# Patient Record
Sex: Male | Born: 1970 | Race: White | Hispanic: No | Smoking: Current every day smoker
Health system: Southern US, Community
[De-identification: ages and names within clinical notes are randomized; demographics above are authoritative.]

---

## 2001-07-15 ENCOUNTER — Emergency Department (HOSPITAL_COMMUNITY): Admission: EM | Admit: 2001-07-15 | Discharge: 2001-07-15 | Payer: Self-pay | Admitting: Emergency Medicine

## 2001-07-15 ENCOUNTER — Encounter: Payer: Self-pay | Admitting: Emergency Medicine

## 2012-04-25 ENCOUNTER — Ambulatory Visit: Payer: PRIVATE HEALTH INSURANCE

## 2012-04-25 ENCOUNTER — Ambulatory Visit (INDEPENDENT_AMBULATORY_CARE_PROVIDER_SITE_OTHER): Payer: PRIVATE HEALTH INSURANCE | Admitting: Internal Medicine

## 2012-04-25 VITALS — BP 136/77 | HR 56 | Temp 98.0°F | Resp 16 | Ht 68.0 in | Wt 172.8 lb

## 2012-04-25 DIAGNOSIS — S39012A Strain of muscle, fascia and tendon of lower back, initial encounter: Secondary | ICD-10-CM

## 2012-04-25 DIAGNOSIS — M545 Low back pain, unspecified: Secondary | ICD-10-CM

## 2012-04-25 DIAGNOSIS — IMO0002 Reserved for concepts with insufficient information to code with codable children: Secondary | ICD-10-CM

## 2012-04-25 LAB — POCT URINALYSIS DIPSTICK
Bilirubin, UA: NEGATIVE
Blood, UA: NEGATIVE
Glucose, UA: NEGATIVE
Ketones, UA: NEGATIVE
Leukocytes, UA: NEGATIVE
Nitrite, UA: NEGATIVE
Protein, UA: NEGATIVE
Spec Grav, UA: 1.02
Urobilinogen, UA: 0.2
pH, UA: 7

## 2012-04-25 LAB — POCT UA - MICROSCOPIC ONLY
Bacteria, U Microscopic: NEGATIVE
Casts, Ur, LPF, POC: NEGATIVE
Crystals, Ur, HPF, POC: NEGATIVE
Epithelial cells, urine per micros: NEGATIVE
Mucus, UA: NEGATIVE
RBC, urine, microscopic: NEGATIVE
Yeast, UA: NEGATIVE

## 2012-04-25 MED ORDER — METHOCARBAMOL 750 MG PO TABS: 750.0000 mg | ORAL_TABLET | Freq: Four times a day (QID) | ORAL | Status: DC

## 2012-04-25 MED ORDER — HYDROCODONE-ACETAMINOPHEN 5-500 MG PO TABS: 1.0000 | ORAL_TABLET | Freq: Three times a day (TID) | ORAL | Status: DC | PRN

## 2012-04-25 NOTE — Progress Notes (Signed)
  Subjective:    Patient ID: Jerry Hayes, male    DOB: 09-06-1970, 41 y.o.   MRN: [id number]  HPI Has 2-3d of right LBP, radiates to right upper leg with some pain in testicle. No blood seen in urine. No hx for injury, does difficult physical work and lifts and twists a lot. No weakness, numbness, or incontinence.   Review of Systems smoker    Objective:   Physical Exam  Constitutional: He is oriented to person, place, and time. He appears well-developed and well-nourished. He appears distressed.  Neck: Normal range of motion.  Cardiovascular: Normal rate.   Pulmonary/Chest: Effort normal and breath sounds normal.  Abdominal: Soft. Bowel sounds are normal. There is no tenderness.  Musculoskeletal: He exhibits tenderness.       Lumbar back: He exhibits decreased range of motion, tenderness, pain and spasm. He exhibits no bony tenderness and no deformity.       Back:       Limping St leg painful in back both legs  Neurological: He is alert and oriented to person, place, and time. He has normal reflexes. He exhibits normal muscle tone. Coordination abnormal.  Skin: Skin is warm and dry.  Psychiatric: He has a normal mood and affect.     [facility] reading (PRIMARY) by  Dr.Kianna Billet djd at L5-S1 ccua Results for orders placed in visit on 04/25/12  POCT UA - MICROSCOPIC ONLY      Component Value Range   WBC, Ur, HPF, POC 0-1     RBC, urine, microscopic neg     Bacteria, U Microscopic neg     Mucus, UA neg     Epithelial cells, urine per micros neg     Crystals, Ur, HPF, POC neg     Casts, Ur, LPF, POC neg     Yeast, UA neg    POCT URINALYSIS DIPSTICK      Component Value Range   Color, UA yellow     Clarity, UA clear     Glucose, UA neg     Bilirubin, UA neg     Ketones, UA neg     Spec Grav, UA 1.020     Blood, UA neg     pH, UA 7.0     Protein, UA neg     Urobilinogen, UA 0.2     Nitrite, UA neg     Leukocytes, UA Negative          Assessment & Plan:  Back  manual/Rest/meds

## 2012-04-28 ENCOUNTER — Ambulatory Visit (INDEPENDENT_AMBULATORY_CARE_PROVIDER_SITE_OTHER): Payer: PRIVATE HEALTH INSURANCE | Admitting: Emergency Medicine

## 2012-04-28 VITALS — BP 120/72 | HR 70 | Temp 98.1°F | Resp 16 | Ht 67.75 in | Wt 179.2 lb

## 2012-04-28 DIAGNOSIS — M549 Dorsalgia, unspecified: Secondary | ICD-10-CM

## 2012-04-28 DIAGNOSIS — IMO0002 Reserved for concepts with insufficient information to code with codable children: Secondary | ICD-10-CM

## 2012-04-28 MED ORDER — PREDNISONE 20 MG PO TABS: ORAL_TABLET | ORAL | Status: DC

## 2012-04-28 MED ORDER — OXYCODONE-ACETAMINOPHEN 5-325 MG PO TABS: 1.0000 | ORAL_TABLET | Freq: Three times a day (TID) | ORAL | Status: DC | PRN

## 2012-04-28 NOTE — Patient Instructions (Addendum)
Do not take any more ibuprofen. Take her prednisone as instructed to take her pain medications as needed proceed with your MRI so that we can evaluate your back disease.

## 2012-04-28 NOTE — Progress Notes (Signed)
  Subjective:    Patient ID: Jerry Hayes, male    DOB: Jan 31, 1971, 41 y.o.   MRN: [id number]  HPI  Was seen Sunday for back pain.  Pain radiates from lower back down right leg and back into groin.  Has had some strains and pulls but never anything like this.  No loss of bowel or bladder.  Stool was a little harder but nothing major.  Pain is not getting any better.  Sore in the middle of back.  Sitting hurts worse than laying down.  Standing feels better than anything.      Review of Systems     Objective:   Physical Exam        Assessment & Plan:

## 2012-05-11 ENCOUNTER — Ambulatory Visit (INDEPENDENT_AMBULATORY_CARE_PROVIDER_SITE_OTHER): Payer: PRIVATE HEALTH INSURANCE | Admitting: Emergency Medicine

## 2012-05-11 VITALS — BP 126/71 | HR 76 | Temp 98.7°F | Resp 16 | Ht 67.0 in | Wt 179.0 lb

## 2012-05-11 DIAGNOSIS — M5430 Sciatica, unspecified side: Secondary | ICD-10-CM

## 2012-05-11 MED ORDER — HYDROCODONE-ACETAMINOPHEN 5-325 MG PO TABS: 1.0000 | ORAL_TABLET | ORAL | Status: AC | PRN

## 2012-05-11 NOTE — Progress Notes (Signed)
[facility] [address], [place] [address] [phone]  Date:  05/11/2012   Name:  Jerry Hayes   DOB:  1970/09/17   MRN:  [id number]  PCP:  No primary provider on file.    Chief Complaint: Follow-up   History of Present Illness:  Jerry Hayes is a 41 y.o. very pleasant male patient who presents with the following:  Seen twice with back pain into leg.  First visit just had pain.  Second visit had numbness in leg.  Put on prednisone and percocet on 10/9 and the numbness in right leg has improved but is still present.  In the anterior thigh.  Has returned to work.  Pain markedly worse with sitting. Told that insurance will not pay for MRI for 6 weeks.  There is no problem list on file for this patient.   No past medical history on file.  No past surgical history on file.  History  Substance Use Topics  . Smoking status: Current Every Day Smoker  . Smokeless tobacco: Not on file  . Alcohol Use: Not on file    No family history on file.  No Known Allergies  Medication list has been reviewed and updated.  Current Outpatient Prescriptions on File Prior to Visit  Medication Sig Dispense Refill  . methocarbamol (ROBAXIN-750) 750 MG tablet Take 1 tablet (750 mg total) by mouth 4 (four) times daily.  40 tablet  1  . HYDROcodone-acetaminophen (VICODIN) 5-500 MG per tablet Take 1 tablet by mouth every 8 (eight) hours as needed for pain.  40 tablet  0  . ibuprofen (ADVIL,MOTRIN) 400 MG tablet Take 400 mg by mouth every 6 (six) hours as needed.      Marland Kitchen oxyCODONE-acetaminophen (ROXICET) 5-325 MG per tablet Take 1 tablet by mouth every 8 (eight) hours as needed for pain.  20 tablet  0    Review of Systems:  As per HPI, otherwise negative.    Physical Examination: Filed Vitals:   05/11/12 1501  BP: 126/71  Pulse: 76  Temp: 98.7 F (37.1 C)  Resp: 16   Filed Vitals:   05/11/12 1501  Height: 5\' 7"  (1.702 m)  Weight: 179 lb (81.194 kg)   Body  mass index is 28.04 kg/(m^2). Ideal Body Weight: Weight in (lb) to have BMI = 25: 159.3   GEN: WDWN, NAD, Non-toxic, A & O x 3 HEENT: Atraumatic, Normocephalic. Neck supple. No masses, No LAD. Ears and Nose: No external deformity. CV: RRR, No M/G/R. No JVD. No thrill. No extra heart sounds. PULM: CTA B, no wheezes, crackles, rhonchi. No retractions. No resp. distress. No accessory muscle use. ABD: S, NT, ND, +BS. No rebound. No HSM. EXTR: No c/c/e NEURO antalgic gait.   Decreased reflex knee and ankle on right.  Motor intact PSYCH: Normally interactive. Conversant. Not depressed or anxious appearing.  Calm demeanor.  BACK:  Tender lumbar paraspinous on right and in sciatic notch.    Assessment and Plan: Sciatic neuritis Pursue MRI   Carmelina Dane, MD  I have reviewed and agree with documentation. Robert P. Merla Riches, M.D.

## 2013-01-18 ENCOUNTER — Ambulatory Visit (INDEPENDENT_AMBULATORY_CARE_PROVIDER_SITE_OTHER): Payer: PRIVATE HEALTH INSURANCE | Admitting: Physician Assistant

## 2013-01-18 VITALS — BP 108/72 | HR 63 | Temp 97.9°F | Resp 16 | Ht 68.8 in | Wt 183.6 lb

## 2013-01-18 DIAGNOSIS — H60393 Other infective otitis externa, bilateral: Secondary | ICD-10-CM

## 2013-01-18 DIAGNOSIS — H60399 Other infective otitis externa, unspecified ear: Secondary | ICD-10-CM

## 2013-01-18 MED ORDER — CIPROFLOXACIN-DEXAMETHASONE 0.3-0.1 % OT SUSP: 4.0000 [drp] | Freq: Two times a day (BID) | OTIC | Status: DC

## 2013-01-18 MED ORDER — TRAMADOL HCL 50 MG PO TABS: 50.0000 mg | ORAL_TABLET | Freq: Three times a day (TID) | ORAL | Status: DC | PRN

## 2013-01-18 NOTE — Patient Instructions (Addendum)
Begin using the Ciprodex drops twice daily in both ears.  Lay on your side for a few minutes after instilling the drops to ensure that they stay in the ear canal.  Ibuprofen 800mg  every 8 hours with food.  May also use tramadol every 8 hours (alternate these).  Please let us know if any symptoms are worsening or not improving.  I expect that with in the next 1-2 days you will be feeling much better.  Continue the drops for 7-10 days.   Otitis Externa Otitis externa is a bacterial or fungal infection of the outer ear canal. This is the area from the eardrum to the outside of the ear. Otitis externa is sometimes called "swimmer's ear." CAUSES  Possible causes of infection include:  Swimming in dirty water.  Moisture remaining in the ear after swimming or bathing.  Mild injury (trauma) to the ear.  Objects stuck in the ear (foreign body).  Cuts or scrapes (abrasions) on the outside of the ear. SYMPTOMS  The first symptom of infection is often itching in the ear canal. Later signs and symptoms may include swelling and redness of the ear canal, ear pain, and yellowish-white fluid (pus) coming from the ear. The ear pain may be worse when pulling on the earlobe. DIAGNOSIS  Your caregiver will perform a physical exam. A sample of fluid may be taken from the ear and examined for bacteria or fungi. TREATMENT  Antibiotic ear drops are often given for 10 to 14 days. Treatment may also include pain medicine or corticosteroids to reduce itching and swelling. PREVENTION   Keep your ear dry. Use the corner of a towel to absorb water out of the ear canal after swimming or bathing.  Avoid scratching or putting objects inside your ear. This can damage the ear canal or remove the protective wax that lines the canal. This makes it easier for bacteria and fungi to grow.  Avoid swimming in lakes, polluted water, or poorly chlorinated pools.  You may use ear drops made of rubbing alcohol and vinegar after  swimming. Combine equal parts of white vinegar and alcohol in a bottle. Put 3 or 4 drops into each ear after swimming. HOME CARE INSTRUCTIONS   Apply antibiotic ear drops to the ear canal as prescribed by your caregiver.  Only take over-the-counter or prescription medicines for pain, discomfort, or fever as directed by your caregiver.  If you have diabetes, follow any additional treatment instructions from your caregiver.  Keep all follow-up appointments as directed by your caregiver. SEEK MEDICAL CARE IF:   You have a fever.  Your ear is still red, swollen, painful, or draining pus after 3 days.  Your redness, swelling, or pain gets worse.  You have a severe headache.  You have redness, swelling, pain, or tenderness in the area behind your ear. MAKE SURE YOU:   Understand these instructions.  Will watch your condition.  Will get help right away if you are not doing well or get worse. Document Released: 07/07/2005 Document Revised: 09/29/2011 Document Reviewed: 07/24/2011 [facility] Patient Information 2014 [place], [place].

## 2013-01-18 NOTE — Progress Notes (Signed)
  Subjective:    Patient ID: Jerry Hayes, male    DOB: 18-Feb-1971, 42 y.o.   MRN: [id number]  HPI   Jerry Hayes is a very pleasant 42 yr old male here with 5 days of bilateral ear pain.  Began with left ear pain.  Left ear draining fluid - clear but has been blood tinged.  Has trouble sleeping on the ear due to pain.  Hearing does feel a little muffled, like he's in a tunnel at times.  Felt like maybe the ear had to pop but had extreme pain when tried to do so.  Right ear now hurting as well.  No fever.  Has been using Tylenol, Ibuprofen, and wife's tramadol for pain control.  Denies URI symptoms.  Denies possibility of FB.  Feels like equilibrium may be a little off but does not endorse dizziness.  No HA.  Used to have severe ear aches as a child, but nothing for many years.     Review of Systems  Constitutional: Negative for fever and chills.  HENT: Positive for ear pain and ear discharge. Negative for congestion, sore throat and rhinorrhea.   Respiratory: Negative for cough, shortness of breath and wheezing.   Cardiovascular: Negative.   Gastrointestinal: Negative.   Musculoskeletal: Negative.   Neurological: Negative.        Objective:   Physical Exam  Vitals reviewed. Constitutional: He is oriented to person, place, and time. He appears well-developed and well-nourished. No distress.  HENT:  Head: Normocephalic and atraumatic.  Right Ear: Tympanic membrane normal. There is swelling and tenderness.  Left Ear: Tympanic membrane normal. There is drainage, swelling and tenderness.  Mouth/Throat: Uvula is midline, oropharynx is clear and moist and mucous membranes are normal.  Bilateral ears with significant tenderness on manipulation of external ear and tragus; bilateral canals swollen, erythematous, with a small amount of debri; TMs difficult to completely visualize due to swelling and pain but appear to be intact  Eyes: Conjunctivae are normal. No scleral icterus.  Neck: Neck supple.   Cardiovascular: Normal rate, regular rhythm and normal heart sounds.   Pulmonary/Chest: Effort normal. He has wheezes (few scattered wheezes (pt smokes 1 ppd)).  Lymphadenopathy:    He has no cervical adenopathy.  Neurological: He is alert and oriented to person, place, and time.  Skin: Skin is warm and dry.  Psychiatric: He has a normal mood and affect. His behavior is normal.        Assessment & Plan:   Otitis, externa, infective, bilateral - Plan: ciprofloxacin-dexamethasone (CIPRODEX) otic suspension, traMADol (ULTRAM) 50 MG tablet   Jerry Hayes is a very pleasant 42 yr old male with bilateral otitis externa.  Will treat with Ciprodex drops BID x 7-10 days.  Alternate ibuprofen and tramadol for pain relief.  Avoid swimming, submerging ears for the next 7 days.  RTC if worsening or not improving.

## 2013-04-25 ENCOUNTER — Ambulatory Visit (INDEPENDENT_AMBULATORY_CARE_PROVIDER_SITE_OTHER): Payer: PRIVATE HEALTH INSURANCE | Admitting: Emergency Medicine

## 2013-04-25 VITALS — BP 120/70 | HR 62 | Temp 98.5°F | Resp 18 | Ht 67.5 in | Wt 177.0 lb

## 2013-04-25 DIAGNOSIS — S058X9A Other injuries of unspecified eye and orbit, initial encounter: Secondary | ICD-10-CM

## 2013-04-25 DIAGNOSIS — S0501XA Injury of conjunctiva and corneal abrasion without foreign body, right eye, initial encounter: Secondary | ICD-10-CM

## 2013-04-25 MED ORDER — CIPROFLOXACIN HCL 0.3 % OP SOLN: 1.0000 [drp] | OPHTHALMIC | Status: DC

## 2013-04-25 MED ORDER — HYDROCODONE-ACETAMINOPHEN 5-325 MG PO TABS: 1.0000 | ORAL_TABLET | ORAL | Status: DC | PRN

## 2013-04-25 NOTE — Progress Notes (Signed)
[facility] [address], [place] [address] [phone]  Date:  04/25/2013   Name:  Jerry Hayes   DOB:  Feb 24, 1971   MRN:  [id number]  PCP:  No PCP Per Patient    Chief Complaint: Eye Problem   History of Present Illness:  Jerry Hayes is a 42 y.o. very pleasant male patient who presents with the following:  Cutting grass yesterday and had pain in his right eye.  Continues to have pain and some photophobia.  No further FB sensation.  No visual impairment.  No improvement with over the counter medications or other home remedies.   There are no active problems to display for this patient.   History reviewed. No pertinent past medical history.  History reviewed. No pertinent past surgical history.  History  Substance Use Topics  . Smoking status: Current Every Day Smoker -- 1.00 packs/day  . Smokeless tobacco: Not on file  . Alcohol Use: 1.0 oz/week    2 drink(s) per week    Family History  Problem Relation Age of Onset  . Adopted: Yes    No Known Allergies  Medication list has been reviewed and updated.  Current Outpatient Prescriptions on File Prior to Visit  Medication Sig Dispense Refill  . ciprofloxacin-dexamethasone (CIPRODEX) otic suspension Place 4 drops into both ears 2 (two) times daily.  7.5 mL  0  . traMADol (ULTRAM) 50 MG tablet Take 1 tablet (50 mg total) by mouth every 8 (eight) hours as needed for pain.  10 tablet  0   No current facility-administered medications on file prior to visit.    Review of Systems:  As per HPI, otherwise negative.    Physical Examination: Filed Vitals:   04/25/13 1738  BP: 120/70  Pulse: 62  Temp: 98.5 F (36.9 C)  Resp: 18   Filed Vitals:   04/25/13 1738  Height: 5' 7.5" (1.715 m)  Weight: 177 lb (80.287 kg)   Body mass index is 27.3 kg/(m^2). Ideal Body Weight: Weight in (lb) to have BMI = 25: 161.7   GEN: WDWN, NAD, Non-toxic, Alert & Oriented x 3 HEENT: Atraumatic,  Normocephalic.  Ears and Nose: No external deformity. EXTR: No clubbing/cyanosis/edema NEURO: Normal gait.  PSYCH: Normally interactive. Conversant. Not depressed or anxious appearing.  Calm demeanor.  RIGHT eye:  PRRERLA EOMI  Sclera injected.  With no visible foreign body.  No hyphema.  No fluorescein uptake.  Assessment and Plan: Resolving corneal abrasion cipro vicodin To see eye tomorrow if not resolved   Signed,  Phillips Odor, MD

## 2013-04-25 NOTE — Patient Instructions (Addendum)
Corneal Abrasion  The cornea is the clear covering at the front and center of the eye. When looking at the colored portion (iris) of the eye, you are looking through that person's cornea.   This very thin tissue is made up of many layers. The surface layer is a single layer of cells called the corneal epithelium. This is one of the most sensitive tissues in the body. If a scratch or injury causes the corneal epithelium to come off, it is called a corneal abrasion. If the injury extends to the tissues below the epithelium, the condition is called a corneal ulcer.   CAUSES   · Scratches.  · Trauma.  · Foreign body in the eye.  · Some people have recurrences of abrasions in the area of the original injury even after they heal. This is called recurrent erosion syndrome. Recurrent erosion syndromes generally improve and go away with time.  SYMPTOMS   · Eye pain.  · Difficulty or inability to keep the injured eye open.  · The eye becomes very sensitive to light.  · Recurrent erosions tend to happen suddenly, first thing in the morning  usually upon awakening and opening the eyes.  DIAGNOSIS   Your eye professional can diagnose a corneal abrasion during an eye exam. Dye is usually placed in the eye using a drop or a small paper strip moistened by the patient's tears. When the eye is examined with a special light, the abrasion shows up clearly because of the dye.  TREATMENT   · Small abrasions may be treated with antibiotic drops or ointment alone.  · Usually a pressure patch is specially applied. Pressure patches prevent the eye from blinking, allowing the corneal epithelium to heal. Because blinking is less, a pressure patch also reduces the amount of pain present in the eye during healing. Most corneal abrasions heal within 2-3 days with no effect on vision. WARNING: Do not drive or operate machinery while your eye is patched. Your ability to judge distances is impaired.  · If abrasion becomes infected and spreads to the  deeper tissues of the cornea, a corneal ulcer can result. This is serious because it can cause corneal scarring. Corneal scars interfere with light passing through the cornea, and cause a loss of vision in the involved eye.  · If your caregiver has given you a follow-up appointment, it is very important to keep that appointment. Not keeping the appointment could result in a severe eye infection or permanent loss of vision. If there is any problem keeping the appointment, you must call back to this facility for assistance.  SEEK MEDICAL CARE IF:   · You have pain, light sensitivity and a scratchy feeling in one eye (or both).  · Your pressure patch keeps loosening up and you can blink your eye under the patch after treatment.  · Any kind of discharge develops from the involved eye after treatment or if the lids stick together in the morning.  · You have the same symptoms in the morning as you did with the original abrasion days, weeks or months after the abrasion healed.  MAKE SURE YOU:   · Understand these instructions.  · Will watch your condition.  · Will get help right away if you are not doing well or get worse.  Document Released: 07/04/2000 Document Revised: 09/29/2011 Document Reviewed: 02/10/2008  [employer]® Patient Information ©2014 [employer].

## 2013-04-27 ENCOUNTER — Telehealth: Payer: Self-pay

## 2013-04-27 NOTE — Telephone Encounter (Signed)
Dr. Rosealee Albee with Dr. Dione Booze called to let you know that patient Jerry Hayes did not show for his appointment for his corneal abrasion.  She thought you should know.  Debbie / Dr. Dione Booze [phone]

## 2013-10-21 IMAGING — CR DG LUMBAR SPINE COMPLETE 4+V
5 series · 5 of 5 positions shown · non-contrast
Comparison: None

CLINICAL DATA: Low back pain.  No known injury.

LUMBAR SPINE - COMPLETE 4+ VIEW

[AP]
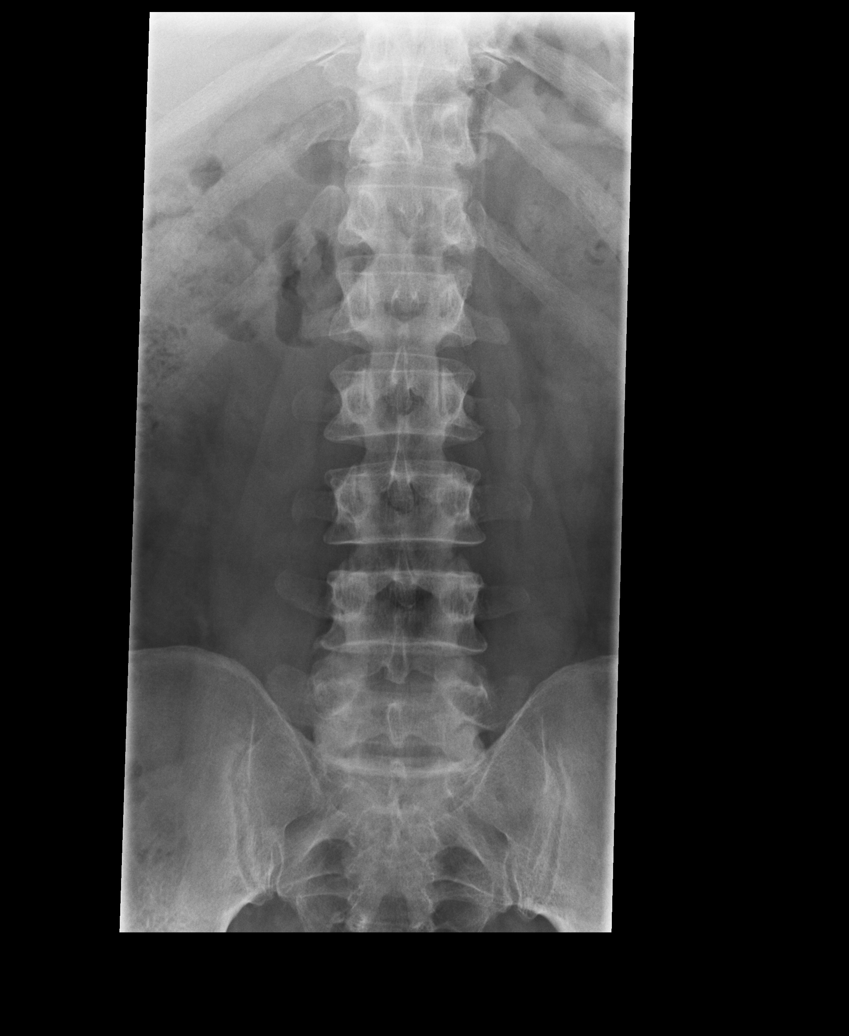

[rpo]
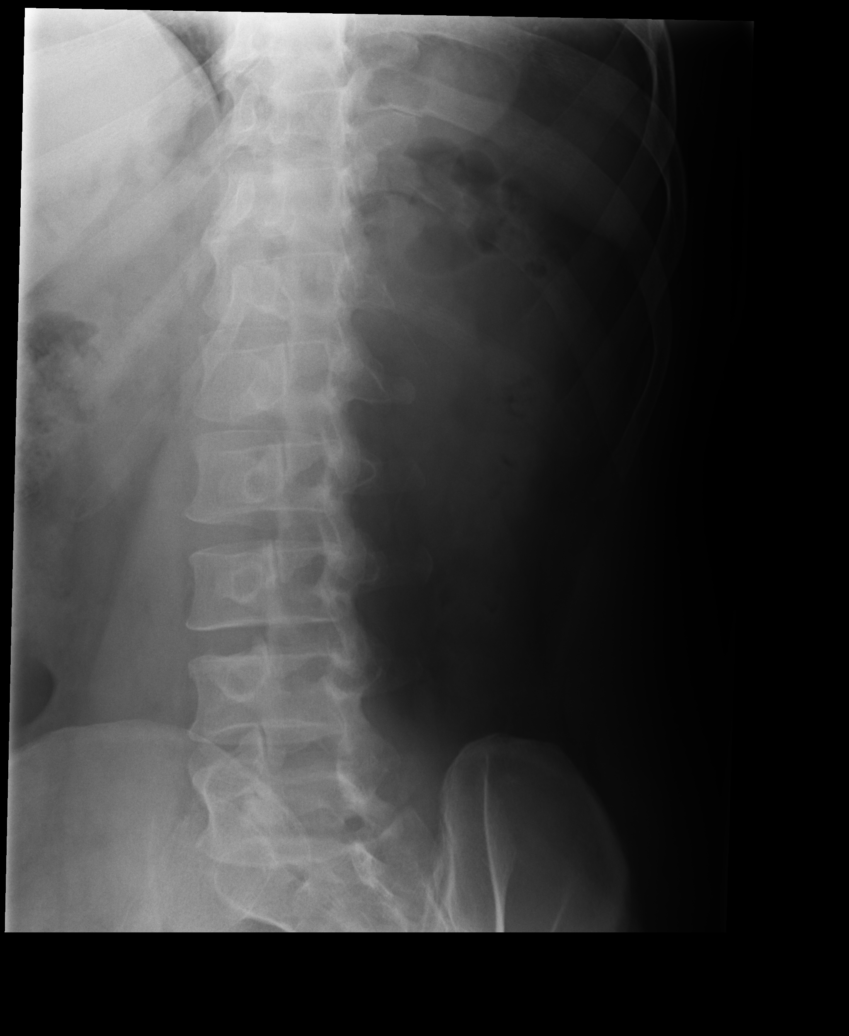

[lpo]
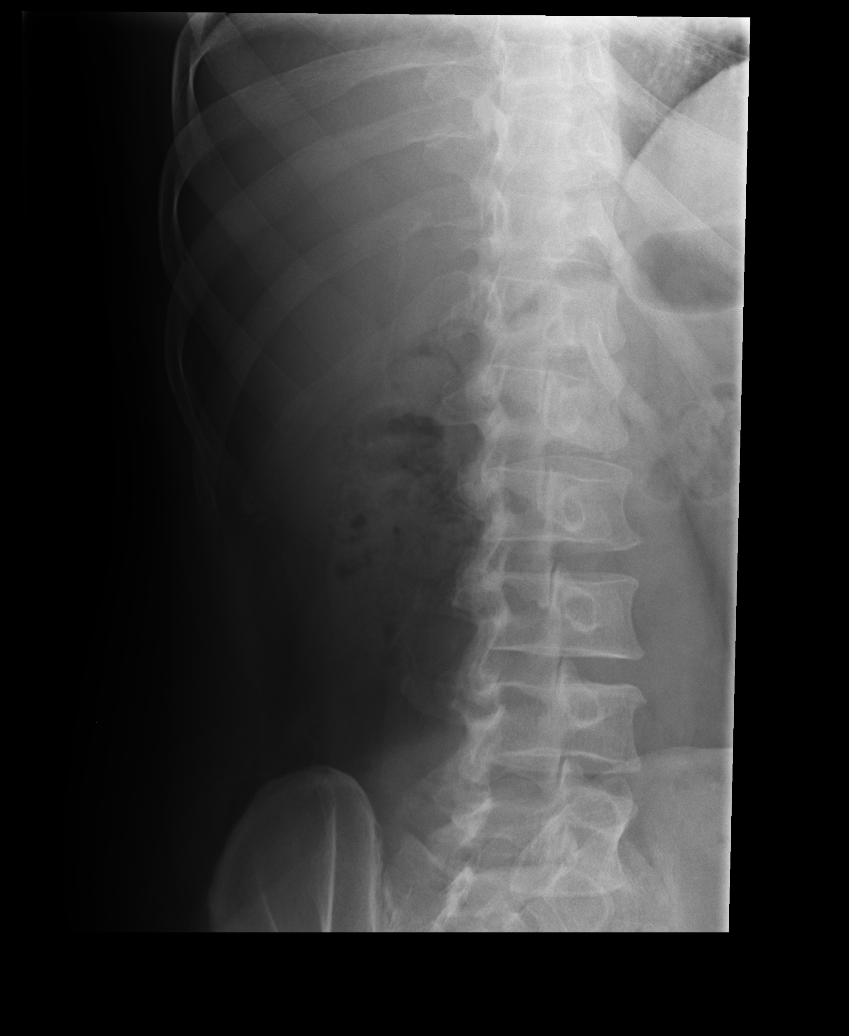

[lateral]
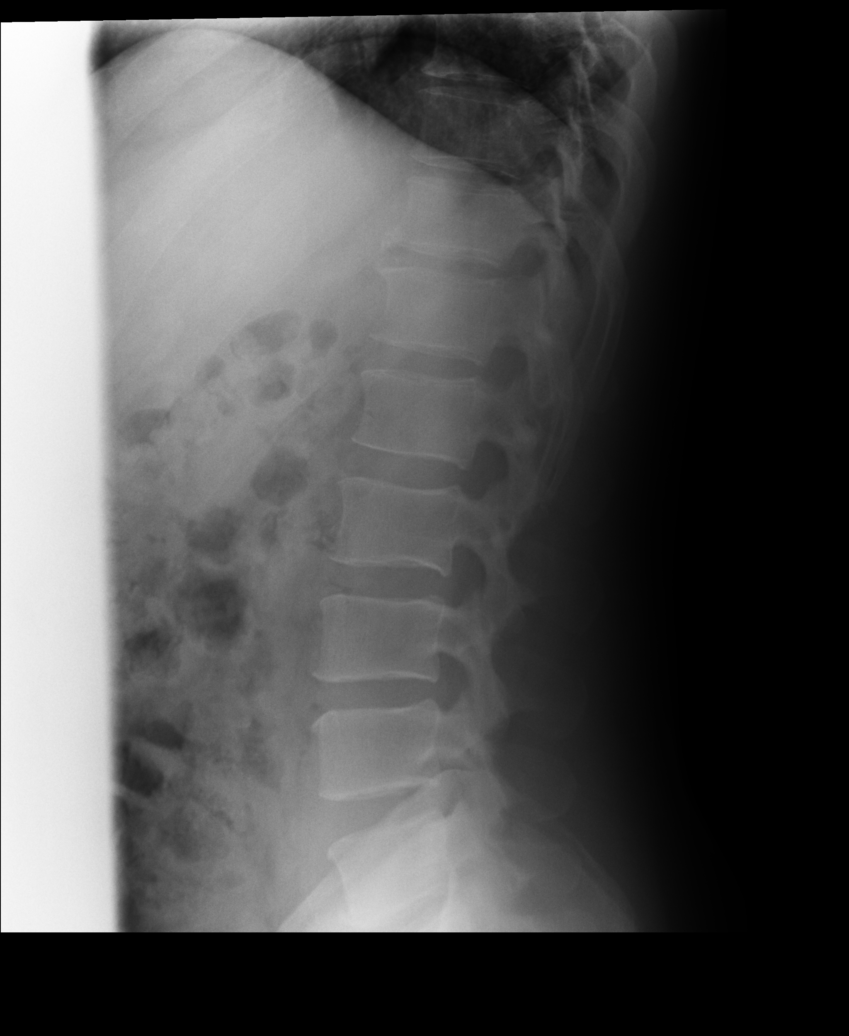

[l5 s1]
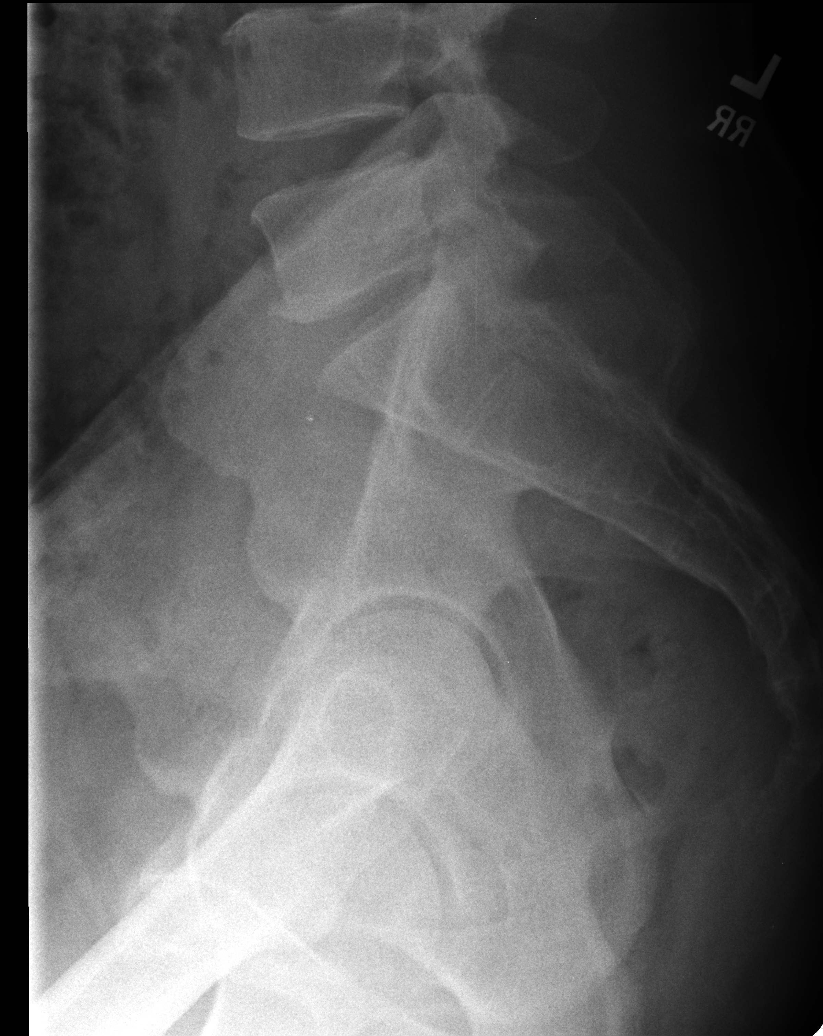

[5 of 5 positions shown; findings below may reference images not displayed]

FINDINGS: There are five lumbar-type vertebral bodies.  No fracture
or malalignment.  Disc spaces maintained.  SI joints are symmetric.
Slight disc space narrowing at L5-S1 suggesting early degenerative
changes.
IMPRESSION: No acute findings.  Early degenerative changes at L5 S1.

## 2015-03-24 ENCOUNTER — Encounter (HOSPITAL_COMMUNITY): Payer: Self-pay | Admitting: Emergency Medicine

## 2015-03-24 ENCOUNTER — Emergency Department (HOSPITAL_COMMUNITY)
Admission: EM | Admit: 2015-03-24 | Discharge: 2015-03-24 | Disposition: A | Discharge status: Home / Self Care | Payer: BLUE CROSS/BLUE SHIELD | Source: Home / Self Care | Attending: Family Medicine | Admitting: Family Medicine

## 2015-03-24 DIAGNOSIS — H6091 Unspecified otitis externa, right ear: Secondary | ICD-10-CM | POA: Diagnosis not present

## 2015-03-24 DIAGNOSIS — H6011 Cellulitis of right external ear: Secondary | ICD-10-CM | POA: Diagnosis not present

## 2015-03-24 MED ORDER — CEPHALEXIN 500 MG PO CAPS: 500.0000 mg | ORAL_CAPSULE | Freq: Three times a day (TID) | ORAL | Status: DC

## 2015-03-24 MED ORDER — NEOMYCIN-POLYMYXIN-HC 3.5-10000-1 OT SOLN: 3.0000 [drp] | Freq: Four times a day (QID) | OTIC | Status: DC

## 2015-03-24 NOTE — ED Notes (Addendum)
Pt here with right ear inner canal swelling, redness and clear drainage Started yesterday with pain, radiating pain down R jaw and behind ear Pain on assessment Denies fever, chills  Ear drops, garlic used

## 2015-03-24 NOTE — Discharge Instructions (Signed)
You have an external ear canal infection. Please start the eardrops. Please take 800 mg of ibuprofen every 8 hours for the pain and swelling. If your symptoms do not improve in another 1-2 days please start the Keflex area did

## 2015-03-24 NOTE — ED Provider Notes (Signed)
CSN: [id number]     Arrival date & time 03/24/15  1548 History   First MD Initiated Contact with Patient 03/24/15 1601     Chief Complaint  Patient presents with  . Ear Drainage   (Consider location/radiation/quality/duration/timing/severity/associated sxs/prior Treatment) HPI   R ear drainage. Ongoing for 1 day ago. Constant. Painful. Non radiating. Muffled hearing on R. OTC ear ear drops w/o benefit. Garlic oil w/o improvement. Has had recurring ear infections since the previous year when family swim at a specific pool that they feel was clean. Denies any fevers, headache, neck stiffness, chest pain, shortness breath, rash, palpitations.  History reviewed. No pertinent past medical history. History reviewed. No pertinent past surgical history. Family History  Problem Relation Age of Onset  . Adopted: Yes   Social History  Substance Use Topics  . Smoking status: Current Every Day Smoker -- 1.00 packs/day  . Smokeless tobacco: None  . Alcohol Use: 1.0 oz/week    2 Standard drinks or equivalent per week    Review of Systems Per HPI with all other pertinent systems negative.   Allergies  Review of patient's allergies indicates no known allergies.  Home Medications   Prior to Admission medications   Medication Sig Start Date End Date Taking? Authorizing Provider  cephALEXin (KEFLEX) 500 MG capsule Take 1 capsule (500 mg total) by mouth 3 (three) times daily. 03/24/15   Ozella Rocks, MD  neomycin-polymyxin-hydrocortisone (CORTISPORIN) otic solution Place 3-4 drops into both ears 4 (four) times daily. Treat for 5-7 days 03/24/15   Ozella Rocks, MD   Meds Ordered and Administered this Visit  Medications - No data to display  BP 131/84 mmHg  Pulse 59  Temp(Src) 98.5 F (36.9 C) (Oral)  Resp 20  SpO2 97% No data found.   Physical Exam Physical Exam  Constitutional: oriented to person, place, and time. appears well-developed and well-nourished. No distress.  HENT:   Left TM normal. Left ear canal normal. Right external ear canal very edematous with extension of the induration and mild erythema to the right tragus and pre-and superior aricular areas. Head: Normocephalic and atraumatic.  Eyes: EOMI. PERRL.  Neck: Normal range of motion.  Cardiovascular: RRR, no m/r/g, 2+ distal pulses,  Pulmonary/Chest: Effort normal and breath sounds normal. No respiratory distress.  Abdominal: Soft. Bowel sounds are normal. NonTTP, no distension.  Musculoskeletal: Normal range of motion. Non ttp, no effusion.  Neurological: alert and oriented to person, place, and time.  Skin: Skin is warm. No rash noted. non diaphoretic.  Psychiatric: normal mood and affect. behavior is normal. Judgment and thought content normal.   ED Course  Procedures (including critical care time)  Labs Review Labs Reviewed - No data to display  Imaging Review No results found.   Visual Acuity Review  Right Eye Distance:   Left Eye Distance:   Bilateral Distance:    Right Eye Near:   Left Eye Near:    Bilateral Near:         MDM   1. Otitis externa, right   2. Cellulitis of ear, right    Start Cortisporin. If this does not improve patient will start Keflex. Look for improvement in pain and swelling.    Ozella Rocks, MD 03/24/15 [phone]

## 2017-08-24 ENCOUNTER — Other Ambulatory Visit: Payer: Self-pay

## 2017-08-24 ENCOUNTER — Encounter (HOSPITAL_COMMUNITY): Payer: Self-pay | Admitting: Emergency Medicine

## 2017-08-24 ENCOUNTER — Telehealth (HOSPITAL_COMMUNITY): Payer: Self-pay | Admitting: *Deleted

## 2017-08-24 ENCOUNTER — Ambulatory Visit (HOSPITAL_COMMUNITY)
Admission: EM | Admit: 2017-08-24 | Discharge: 2017-08-24 | Disposition: A | Discharge status: Home / Self Care | Payer: BLUE CROSS/BLUE SHIELD | Attending: Family Medicine | Admitting: Family Medicine

## 2017-08-24 DIAGNOSIS — H10021 Other mucopurulent conjunctivitis, right eye: Secondary | ICD-10-CM | POA: Diagnosis not present

## 2017-08-24 DIAGNOSIS — H6123 Impacted cerumen, bilateral: Secondary | ICD-10-CM | POA: Diagnosis not present

## 2017-08-24 DIAGNOSIS — H109 Unspecified conjunctivitis: Secondary | ICD-10-CM

## 2017-08-24 MED ORDER — TOBRAMYCIN 0.3 % OP SOLN: 1.0000 [drp] | Freq: Four times a day (QID) | OPHTHALMIC | 0 refills | Status: DC

## 2017-08-24 NOTE — ED Provider Notes (Signed)
[facility]   [id number] 08/24/17 Arrival Time: 1609   SUBJECTIVE:  Jerry Hayes is a 47 y.o. male who presents to the urgent care with complaint of hearing issues in right ear, since then, left ear is involved, today right eye is very red.    The ear started feeling clogged yesterday, first the right and now the left.   History reviewed. No pertinent past medical history. Family History  Adopted: Yes   Social History   Socioeconomic History  . Marital status: Single    Spouse name: Not on file  . Number of children: Not on file  . Years of education: Not on file  . Highest education level: Not on file  Social Needs  . Financial resource strain: Not on file  . Food insecurity - worry: Not on file  . Food insecurity - inability: Not on file  . Transportation needs - medical: Not on file  . Transportation needs - non-medical: Not on file  Occupational History  . Not on file  Tobacco Use  . Smoking status: Current Every Day Smoker    Packs/day: 1.00  Substance and Sexual Activity  . Alcohol use: Yes    Alcohol/week: 1.0 oz    Types: 2 Standard drinks or equivalent per week  . Drug use: No  . Sexual activity: Yes  Other Topics Concern  . Not on file  Social History Narrative  . Not on file   No outpatient medications have been marked as taking for the 08/24/17 encounter [facility] Encounter).   No Known Allergies    ROS: As per HPI, remainder of ROS negative.   OBJECTIVE:   Vitals:   08/24/17 1722  BP: (!) 143/98  Pulse: (!) 56  Resp: (!) 22  Temp: 97.7 F (36.5 C)  TempSrc: Oral  SpO2: 98%     General appearance: alert; no distress Eyes: PERRL; EOMI; conjunctiva injected on right HENT: normocephalic; atraumatic; TMs normal after lavage with restored hearing, canal cerumen bilaterally, external ears normal without trauma; nasal mucosa normal; oral mucosa normal Neck: supple Back: no CVA tenderness Extremities: no cyanosis or edema;  symmetrical with no gross deformities Skin: warm and dry Neurologic: normal gait; grossly normal Psychological: alert and cooperative; normal mood and affect      Labs:  Results for orders placed or performed in visit on 04/25/12  POCT UA - Microscopic Only  Result Value Ref Range   WBC, Ur, HPF, POC 0-1    RBC, urine, microscopic neg    Bacteria, U Microscopic neg    Mucus, UA neg    Epithelial cells, urine per micros neg    Crystals, Ur, HPF, POC neg    Casts, Ur, LPF, POC neg    Yeast, UA neg   POCT urinalysis dipstick  Result Value Ref Range   Color, UA yellow    Clarity, UA clear    Glucose, UA neg    Bilirubin, UA neg    Ketones, UA neg    Spec Grav, UA 1.020    Blood, UA neg    pH, UA 7.0    Protein, UA neg    Urobilinogen, UA 0.2    Nitrite, UA neg    Leukocytes, UA Negative     Labs Reviewed - No data to display  No results found.     ASSESSMENT & PLAN:  1. Hearing loss due to cerumen impaction, bilateral   2. Conjunctivitis, bacterial     Meds ordered this encounter  Medications  . tobramycin (TOBREX) 0.3 % ophthalmic solution    Sig: Place 1 drop into the right eye every 6 (six) hours.    Dispense:  5 mL    Refill:  0    Reviewed expectations re: course of current medical issues. Questions answered. Outlined signs and symptoms indicating need for more acute intervention. Patient verbalized understanding. After Visit Summary given.    Procedures:      Elvina SidleLauenstein, Deondrick Searls, MD 08/24/17 [phone]

## 2017-08-24 NOTE — Discharge Instructions (Signed)
Wash bed linens so that you do not spread the urine from one eye to the other.

## 2017-08-24 NOTE — ED Triage Notes (Signed)
On Saturday started having hearing issues in right ear, since then, left ear is involved, today right eye is very red.

## 2019-08-18 ENCOUNTER — Encounter (HOSPITAL_COMMUNITY): Payer: Self-pay | Admitting: Emergency Medicine

## 2019-08-18 ENCOUNTER — Other Ambulatory Visit: Payer: Self-pay

## 2019-08-18 ENCOUNTER — Ambulatory Visit (HOSPITAL_COMMUNITY)
Admission: EM | Admit: 2019-08-18 | Discharge: 2019-08-18 | Disposition: A | Discharge status: Home / Self Care | Payer: BC Managed Care – PPO | Attending: Family Medicine | Admitting: Family Medicine

## 2019-08-18 DIAGNOSIS — H1032 Unspecified acute conjunctivitis, left eye: Secondary | ICD-10-CM

## 2019-08-18 MED ORDER — TOBRAMYCIN 0.3 % OP SOLN: 1.0000 [drp] | Freq: Four times a day (QID) | OPHTHALMIC | 0 refills | Status: AC

## 2019-08-18 NOTE — ED Triage Notes (Signed)
Pt here for left eye pain and irritation x 5 days

## 2019-08-18 NOTE — ED Provider Notes (Signed)
[facility]    CSN: [id number] Arrival date & time: 08/18/19  1805      History   Chief Complaint Chief Complaint  Patient presents with  . Conjunctivitis    HPI Jerry Hayes is a 49 y.o. male.   Patient has had redness irritation drainage the left eye for the past 5 days.  He has tried no over-the-counter medicines.  Does endorse some photophobia.  HPI  History reviewed. No pertinent past medical history.  There are no problems to display for this patient.   History reviewed. No pertinent surgical history.     Home Medications    Prior to Admission medications   Medication Sig Start Date End Date Taking? Authorizing Provider  tobramycin (TOBREX) 0.3 % ophthalmic solution Place 1 drop into the right eye every 6 (six) hours. 08/24/17   Elvina Sidle, MD    Family History Family History  Adopted: Yes    Social History Social History   Tobacco Use  . Smoking status: Current Every Day Smoker    Packs/day: 1.00  Substance Use Topics  . Alcohol use: Yes    Alcohol/week: 2.0 standard drinks    Types: 2 Standard drinks or equivalent per week  . Drug use: No     Allergies   Patient has no known allergies.   Review of Systems Review of Systems  Eyes: Positive for photophobia, discharge and redness.  All other systems reviewed and are negative.    Physical Exam Triage Vital Signs ED Triage Vitals  Enc Vitals Group     BP 08/18/19 1822 (!) 172/49     Pulse Rate 08/18/19 1822 75     Resp 08/18/19 1822 18     Temp 08/18/19 1822 98.3 F (36.8 C)     Temp Source 08/18/19 1822 Oral     SpO2 08/18/19 1822 96 %     Weight --      Height --      Head Circumference --      Peak Flow --      Pain Score 08/18/19 1823 6     Pain Loc --      Pain Edu? --      Excl. in GC? --    No data found.  Updated Vital Signs BP (!) 172/49 (BP Location: Right Arm)   Pulse 75   Temp 98.3 F (36.8 C) (Oral)   Resp 18   SpO2 96%   Visual  Acuity Right Eye Distance:   Left Eye Distance:   Bilateral Distance:    Right Eye Near:   Left Eye Near:    Bilateral Near:     Physical Exam Vitals and nursing note reviewed.  Constitutional:      Appearance: Normal appearance. He is obese.  Eyes:     Pupils: Pupils are equal, round, and reactive to light.     Comments: Left eye is markedly injected.  Pupil is equal reactive to light.  There is yellow exudate  Neurological:     Mental Status: He is alert.      UC Treatments / Results  Labs (all labs ordered are listed, but only abnormal results are displayed) Labs Reviewed - No data to display  EKG   Radiology No results found.  Procedures Procedures (including critical care time)  Medications Ordered in UC Medications - No data to display  Initial Impression / Assessment and Plan / UC Course  I have reviewed the triage vital signs and  the nursing notes.  Pertinent labs & imaging results that were available during my care of the patient were reviewed by me and considered in my medical decision making (see chart for details).     Conjunctivitis, OS.  Since this appears to be bacterial process will treat with antibiotic Final Clinical Impressions(s) / UC Diagnoses   Final diagnoses:  None   Discharge Instructions   None    ED Prescriptions    None     PDMP not reviewed this encounter.   Wardell Honour, MD 08/18/19 Bosie Helper

## 2019-08-18 NOTE — Discharge Instructions (Addendum)
Use antibiotic drops as prescribed Wash hands frequently

## 2019-08-24 ENCOUNTER — Ambulatory Visit (INDEPENDENT_AMBULATORY_CARE_PROVIDER_SITE_OTHER)
Admission: RE | Admit: 2019-08-24 | Discharge: 2019-08-24 | Disposition: A | Discharge status: Home / Self Care | Payer: BC Managed Care – PPO | Source: Ambulatory Visit

## 2019-08-24 DIAGNOSIS — H1033 Unspecified acute conjunctivitis, bilateral: Secondary | ICD-10-CM

## 2019-08-24 MED ORDER — POLYMYXIN B-TRIMETHOPRIM 10000-0.1 UNIT/ML-% OP SOLN: 1.0000 [drp] | Freq: Four times a day (QID) | OPHTHALMIC | 0 refills | Status: AC

## 2019-08-24 NOTE — ED Provider Notes (Signed)
Virtual Visit via Video Note:  Jerry Hayes  initiated request for Telemedicine visit with [facility] team. I connected with Jerry Hayes  on 08/24/2019 at 5:44 PM  for a synchronized telemedicine visit using a video enabled HIPPA compliant telemedicine application. I verified that I am speaking with Jerry Hayes  using two identifiers. Jerry Bail, NP  was physically located in a [facility]gent care site and Jerry Hayes was located at a different location.   The limitations of evaluation and management by telemedicine as well as the availability of in-person appointments were discussed. Patient was informed that he  may incur a bill ( including co-pay) for this virtual visit encounter. Jerry Hayes  expressed understanding and gave verbal consent to proceed with virtual visit.     History of Present Illness:Jerry Hayes  is a 49 y.o. male presents for evaluation of eye tearing, irritated, redness in both eyes, L>R.  He reports his eyes are crusted in the mornings.  He was treated for conjunctivitis in his left eye on 08/18/2019 with Tobramycin; He has been using the prescribed eye drops in both eyes as directed and used all of the medication.  He denies acute eye pain or changes in vision.  He denies fever, chills, or other symptoms.      No Known Allergies   No past medical history on file.   Social History   Tobacco Use  . Smoking status: Current Every Day Smoker    Packs/day: 1.00  Substance Use Topics  . Alcohol use: Yes    Alcohol/week: 2.0 standard drinks    Types: 2 Standard drinks or equivalent per week  . Drug use: No        Observations/Objective: Physical Exam  VITALS: Patient denies fever. GENERAL: Alert, appears well and in no acute distress. HEENT: Atraumatic.   NECK: Normal movements of the head and neck. CARDIOPULMONARY: No increased WOB. Speaking in clear sentences. I:E ratio WNL.  MS: Moves all visible extremities without noticeable  abnormality. PSYCH: Pleasant and cooperative, well-groomed. Speech normal rate and rhythm. Affect is appropriate. Insight and judgement are appropriate. Attention is focused, linear, and appropriate.  NEURO: CN grossly intact. Oriented as arrived to appointment on time with no prompting. Moves both UE equally.  SKIN: No obvious lesions, wounds, erythema, or cyanosis noted on face or hands.   Assessment and Plan:    ICD-10-CM   1. Acute bacterial conjunctivitis of both eyes  H10.33        Follow Up Instructions: Treating with Polytrim eyedrops.  Instructed patient to follow-up with his eye care provider if his symptoms are not improving.  Instructed him to go to the ED if he has acute eye pain or changes in his vision.  Patient agrees to plan of care.      I discussed the assessment and treatment plan with the patient. The patient was provided an opportunity to ask questions and all were answered. The patient agreed with the plan and demonstrated an understanding of the instructions.   The patient was advised to call back or seek an in-person evaluation if the symptoms worsen or if the condition fails to improve as anticipated.      Jerry Bail, NP  08/24/2019 5:44 PM         Jerry Bail, NP 08/24/19 1745

## 2019-08-24 NOTE — Discharge Instructions (Addendum)
Use the eyedrops as directed.    Follow-up with your eye care provider if your symptoms are not improving.    Go to the emergency department if you have acute eye pain or changes in your vision.
